# Patient Record
Sex: Female | Born: 1970 | Hispanic: Yes | Marital: Married
Health system: Southern US, Community
[De-identification: ages and names within clinical notes are randomized; demographics above are authoritative.]

---

## 2012-02-23 ENCOUNTER — Other Ambulatory Visit: Payer: Self-pay | Admitting: Internal Medicine

## 2012-02-23 DIAGNOSIS — M549 Dorsalgia, unspecified: Secondary | ICD-10-CM

## 2012-02-28 ENCOUNTER — Ambulatory Visit
Admission: RE | Admit: 2012-02-28 | Discharge: 2012-02-28 | Disposition: A | Discharge status: Home / Self Care | Payer: 59 | Source: Ambulatory Visit | Attending: Internal Medicine | Admitting: Internal Medicine

## 2012-02-28 DIAGNOSIS — M549 Dorsalgia, unspecified: Secondary | ICD-10-CM

## 2012-03-25 ENCOUNTER — Other Ambulatory Visit: Payer: Self-pay | Admitting: Internal Medicine

## 2012-03-25 DIAGNOSIS — N9489 Other specified conditions associated with female genital organs and menstrual cycle: Secondary | ICD-10-CM

## 2012-03-29 ENCOUNTER — Ambulatory Visit
Admission: RE | Admit: 2012-03-29 | Discharge: 2012-03-29 | Disposition: A | Discharge status: Home / Self Care | Payer: 59 | Source: Ambulatory Visit | Attending: Internal Medicine | Admitting: Internal Medicine

## 2012-03-29 DIAGNOSIS — N9489 Other specified conditions associated with female genital organs and menstrual cycle: Secondary | ICD-10-CM

## 2012-04-02 ENCOUNTER — Other Ambulatory Visit: Payer: Self-pay | Admitting: Internal Medicine

## 2012-04-02 ENCOUNTER — Other Ambulatory Visit (HOSPITAL_COMMUNITY)
Admission: RE | Admit: 2012-04-02 | Discharge: 2012-04-02 | Disposition: A | Discharge status: Home / Self Care | Payer: 59 | Source: Ambulatory Visit | Attending: Internal Medicine | Admitting: Internal Medicine

## 2012-04-02 DIAGNOSIS — Z01419 Encounter for gynecological examination (general) (routine) without abnormal findings: Secondary | ICD-10-CM | POA: Insufficient documentation

## 2012-04-30 ENCOUNTER — Other Ambulatory Visit: Payer: Self-pay | Admitting: Internal Medicine

## 2012-05-11 ENCOUNTER — Ambulatory Visit
Admission: RE | Admit: 2012-05-11 | Discharge: 2012-05-11 | Disposition: A | Discharge status: Home / Self Care | Payer: 59 | Source: Ambulatory Visit | Attending: Internal Medicine | Admitting: Internal Medicine

## 2012-12-21 ENCOUNTER — Other Ambulatory Visit: Payer: Self-pay | Admitting: Internal Medicine

## 2012-12-21 DIAGNOSIS — M79605 Pain in left leg: Secondary | ICD-10-CM

## 2012-12-21 DIAGNOSIS — R1011 Right upper quadrant pain: Secondary | ICD-10-CM

## 2012-12-23 ENCOUNTER — Ambulatory Visit
Admission: RE | Admit: 2012-12-23 | Discharge: 2012-12-23 | Disposition: A | Discharge status: Home / Self Care | Payer: 59 | Source: Ambulatory Visit | Attending: Internal Medicine | Admitting: Internal Medicine

## 2012-12-23 DIAGNOSIS — R1011 Right upper quadrant pain: Secondary | ICD-10-CM

## 2012-12-23 DIAGNOSIS — M79605 Pain in left leg: Secondary | ICD-10-CM

## 2012-12-27 ENCOUNTER — Ambulatory Visit
Admission: RE | Admit: 2012-12-27 | Discharge: 2012-12-27 | Disposition: A | Discharge status: Home / Self Care | Payer: 59 | Source: Ambulatory Visit | Attending: Internal Medicine | Admitting: Internal Medicine

## 2012-12-27 DIAGNOSIS — R1011 Right upper quadrant pain: Secondary | ICD-10-CM

## 2012-12-27 DIAGNOSIS — M79605 Pain in left leg: Secondary | ICD-10-CM

## 2012-12-31 ENCOUNTER — Other Ambulatory Visit: Payer: 59

## 2013-11-23 ENCOUNTER — Other Ambulatory Visit: Payer: Self-pay | Admitting: Internal Medicine

## 2013-11-23 DIAGNOSIS — R519 Headache, unspecified: Secondary | ICD-10-CM

## 2013-11-23 DIAGNOSIS — R51 Headache: Principal | ICD-10-CM

## 2013-11-29 ENCOUNTER — Ambulatory Visit
Admission: RE | Admit: 2013-11-29 | Discharge: 2013-11-29 | Disposition: A | Discharge status: Home / Self Care | Payer: 59 | Source: Ambulatory Visit | Attending: Internal Medicine | Admitting: Internal Medicine

## 2013-11-29 DIAGNOSIS — R519 Headache, unspecified: Secondary | ICD-10-CM

## 2013-11-29 DIAGNOSIS — R51 Headache: Principal | ICD-10-CM

## 2014-12-12 ENCOUNTER — Other Ambulatory Visit: Payer: Self-pay | Admitting: Obstetrics and Gynecology

## 2014-12-13 LAB — CYTOLOGY - PAP

## 2015-02-27 ENCOUNTER — Other Ambulatory Visit: Payer: Self-pay | Admitting: Internal Medicine

## 2015-02-27 DIAGNOSIS — R1084 Generalized abdominal pain: Secondary | ICD-10-CM

## 2015-03-06 ENCOUNTER — Ambulatory Visit
Admission: RE | Admit: 2015-03-06 | Discharge: 2015-03-06 | Disposition: A | Discharge status: Home / Self Care | Payer: 59 | Source: Ambulatory Visit | Attending: Internal Medicine | Admitting: Internal Medicine

## 2015-03-06 DIAGNOSIS — R1084 Generalized abdominal pain: Secondary | ICD-10-CM

## 2015-04-03 ENCOUNTER — Other Ambulatory Visit: Payer: Self-pay | Admitting: Gastroenterology

## 2015-04-03 DIAGNOSIS — R110 Nausea: Secondary | ICD-10-CM

## 2015-04-03 DIAGNOSIS — R1011 Right upper quadrant pain: Secondary | ICD-10-CM

## 2015-04-16 ENCOUNTER — Ambulatory Visit (HOSPITAL_COMMUNITY): Payer: 59

## 2015-05-01 ENCOUNTER — Ambulatory Visit (HOSPITAL_COMMUNITY): Payer: 59

## 2015-05-15 ENCOUNTER — Ambulatory Visit (HOSPITAL_COMMUNITY)
Admission: RE | Admit: 2015-05-15 | Discharge: 2015-05-15 | Disposition: A | Discharge status: Home / Self Care | Payer: 59 | Source: Ambulatory Visit | Attending: Gastroenterology | Admitting: Gastroenterology

## 2015-05-15 DIAGNOSIS — R1011 Right upper quadrant pain: Secondary | ICD-10-CM | POA: Diagnosis not present

## 2015-05-15 DIAGNOSIS — R110 Nausea: Secondary | ICD-10-CM | POA: Insufficient documentation

## 2015-05-15 MED ORDER — TECHNETIUM TC 99M MEBROFENIN IV KIT
5.0000 | PACK | Freq: Once | INTRAVENOUS | Status: AC | PRN
  Administered 2015-05-15: 5 via INTRAVENOUS

## 2015-05-15 MED ORDER — STERILE WATER FOR INJECTION IJ SOLN
INTRAMUSCULAR | Status: AC
  Filled 2015-05-15: qty 10

## 2015-05-15 MED ORDER — SINCALIDE 5 MCG IJ SOLR
0.0200 ug/kg | Freq: Once | INTRAMUSCULAR | Status: AC
  Administered 2015-05-15: 1.96 ug via INTRAVENOUS

## 2015-05-15 MED ORDER — SINCALIDE 5 MCG IJ SOLR
INTRAMUSCULAR | Status: AC
  Administered 2015-05-15: 1.96 ug via INTRAVENOUS
  Filled 2015-05-15: qty 5

## 2015-11-07 ENCOUNTER — Other Ambulatory Visit: Payer: Self-pay | Admitting: Internal Medicine

## 2015-11-07 DIAGNOSIS — M545 Low back pain: Secondary | ICD-10-CM

## 2015-11-20 ENCOUNTER — Ambulatory Visit
Admission: RE | Admit: 2015-11-20 | Discharge: 2015-11-20 | Disposition: A | Discharge status: Home / Self Care | Payer: 59 | Source: Ambulatory Visit | Attending: Internal Medicine | Admitting: Internal Medicine

## 2015-11-20 DIAGNOSIS — M545 Low back pain: Secondary | ICD-10-CM

## 2016-02-20 IMAGING — NM NM HEPATO W/GB/PHARM/[PERSON_NAME]
3 series · 13 of 13 positions shown · non-contrast
Comparison: None.

CLINICAL DATA: Right upper quadrant pain with nausea

EXAM:
NUCLEAR MEDICINE HEPATOBILIARY IMAGING WITH GALLBLADDER EF
Views:  Anterior, right lateral right upper quadrant
Radionuclide: Technetium 99m Choletec
Dose:  5.0 mCi
Route of administration: Intravenous

[he hepatobiliary · 3.43mm/px · 6 of 60 frames shown (1 of 3)]
[frame 6/60]
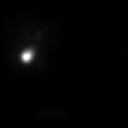
[frame 16/60]
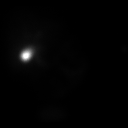
[frame 26/60]
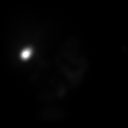
[frame 36/60]
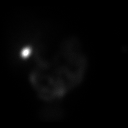
[frame 46/60]
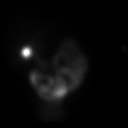
[frame 56/60]
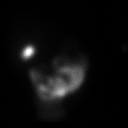

[he hepatobiliary · 3.43mm/px · 6 of 59 frames shown (2 of 3)]
[frame 5/59]
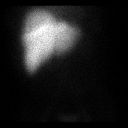
[frame 15/59]
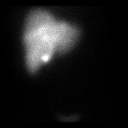
[frame 25/59]
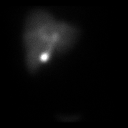
[frame 35/59]
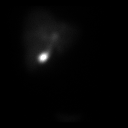
[frame 45/59]
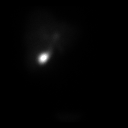
[frame 55/59]
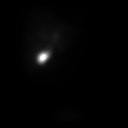

[he hepatobiliary · 1 of 1 slices shown (3 of 3)]
[im 1/1]
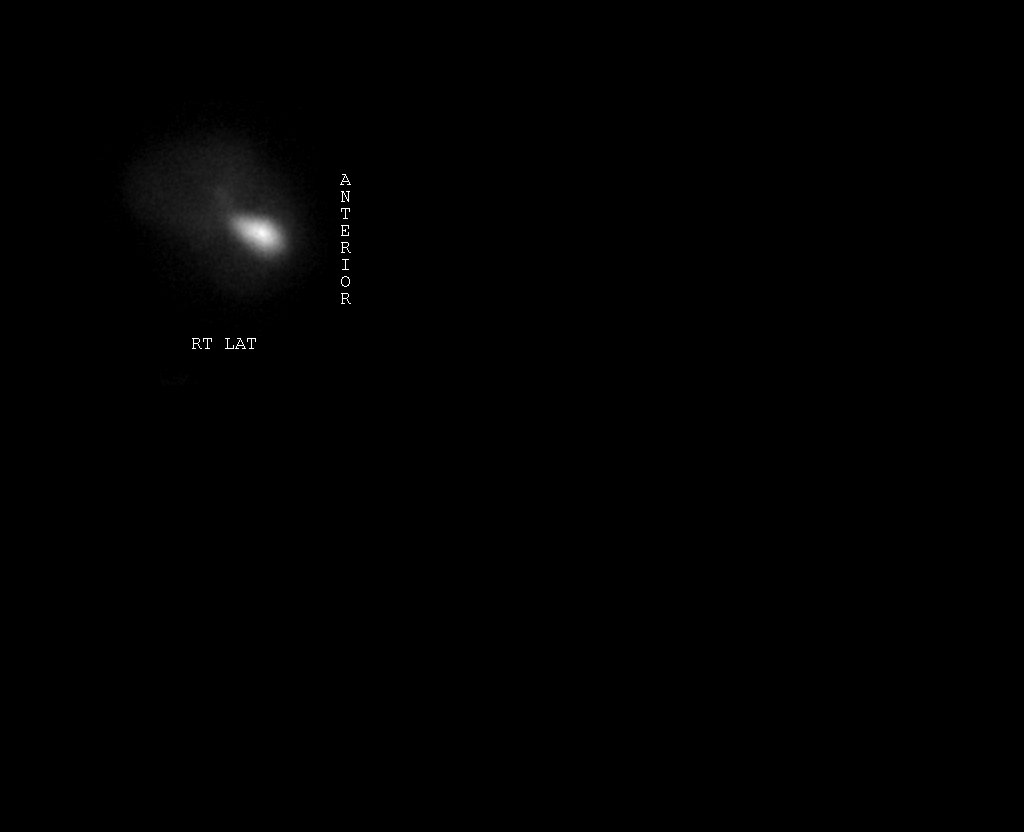

[13 of 13 positions shown; findings below may reference images not displayed]

FINDINGS: Liver uptake of radiotracer is normal. There is prompt visualization
of gallbladder and small bowel, indicating patency of the cystic and
common bile ducts. A weight based dose, 1.96 mcg, of CCK was
administered intravenously with calculation of the computer
generated ejection fraction of radiotracer from the gallbladder. The
patient did experience upper abdominal pain with the CCK
administration. The computer generated ejection fraction of
radiotracer from the gallbladder is normal measuring 72.6%, normal
greater than 38%.
IMPRESSION: The ejection fraction of radiotracer from the gallbladder is normal.
The patient did experience pain with the CCK administration. Cystic
and common bile ducts are patent as is evidenced by visualization of
gallbladder and small bowel.

## 2016-12-17 ENCOUNTER — Other Ambulatory Visit: Payer: Self-pay | Admitting: Obstetrics and Gynecology

## 2016-12-17 DIAGNOSIS — R1031 Right lower quadrant pain: Secondary | ICD-10-CM

## 2016-12-19 ENCOUNTER — Ambulatory Visit
Admission: RE | Admit: 2016-12-19 | Discharge: 2016-12-19 | Disposition: A | Discharge status: Home / Self Care | Payer: 59 | Source: Ambulatory Visit | Attending: Obstetrics and Gynecology | Admitting: Obstetrics and Gynecology

## 2016-12-19 ENCOUNTER — Other Ambulatory Visit: Payer: 59

## 2016-12-19 DIAGNOSIS — R1031 Right lower quadrant pain: Secondary | ICD-10-CM

## 2016-12-19 MED ORDER — IOPAMIDOL (ISOVUE-300) INJECTION 61%
100.0000 mL | Freq: Once | INTRAVENOUS | Status: AC | PRN
  Administered 2016-12-19: 100 mL via INTRAVENOUS
